# Patient Record
Sex: Female | Born: 1970
Health system: Southern US, Community
[De-identification: ages and names within clinical notes are randomized; demographics above are authoritative.]

## PROBLEM LIST (undated history)

## (undated) DIAGNOSIS — F329 Major depressive disorder, single episode, unspecified: Secondary | ICD-10-CM

## (undated) DIAGNOSIS — F32A Depression, unspecified: Secondary | ICD-10-CM

## (undated) DIAGNOSIS — F319 Bipolar disorder, unspecified: Secondary | ICD-10-CM

---

## 2018-05-27 ENCOUNTER — Ambulatory Visit (HOSPITAL_COMMUNITY)
Admission: EM | Admit: 2018-05-27 | Discharge: 2018-05-27 | Disposition: A | Discharge status: Home / Self Care | Payer: Medicaid Other | Attending: Family Medicine | Admitting: Family Medicine

## 2018-05-27 ENCOUNTER — Encounter (HOSPITAL_COMMUNITY): Payer: Self-pay | Admitting: Emergency Medicine

## 2018-05-27 DIAGNOSIS — M545 Low back pain, unspecified: Secondary | ICD-10-CM

## 2018-05-27 HISTORY — DX: Depression, unspecified: F32.A

## 2018-05-27 HISTORY — DX: Bipolar disorder, unspecified: F31.9

## 2018-05-27 HISTORY — DX: Major depressive disorder, single episode, unspecified: F32.9

## 2018-05-27 LAB — POCT URINALYSIS DIP (DEVICE)
Bilirubin Urine: NEGATIVE
GLUCOSE, UA: NEGATIVE mg/dL
Ketones, ur: NEGATIVE mg/dL
Leukocytes, UA: NEGATIVE
NITRITE: NEGATIVE
PROTEIN: NEGATIVE mg/dL
UROBILINOGEN UA: 0.2 mg/dL (ref 0.0–1.0)
pH: 5.5 (ref 5.0–8.0)

## 2018-05-27 MED ORDER — KETOROLAC TROMETHAMINE 60 MG/2ML IM SOLN
60.0000 mg | Freq: Once | INTRAMUSCULAR | Status: AC
  Administered 2018-05-27: 60 mg via INTRAMUSCULAR

## 2018-05-27 MED ORDER — KETOROLAC TROMETHAMINE 60 MG/2ML IM SOLN
INTRAMUSCULAR | Status: AC
  Filled 2018-05-27: qty 2

## 2018-05-27 MED ORDER — METHOCARBAMOL 500 MG PO TABS: 500.0000 mg | ORAL_TABLET | Freq: Four times a day (QID) | ORAL | 0 refills | Status: DC

## 2018-05-27 MED ORDER — NAPROXEN 500 MG PO TABS: 500.0000 mg | ORAL_TABLET | Freq: Two times a day (BID) | ORAL | 0 refills | Status: DC

## 2018-05-27 NOTE — ED Triage Notes (Signed)
Pt states she had lower back pain x1 month, pt states it went away but came back. Pt states the pain shoots through her back and down her hips. Pt also c/o pain in her L hand. Denies injury.

## 2018-05-27 NOTE — Discharge Instructions (Signed)
Back pain is most likely muscular, I would expect this to gradually improve over the next 2 weeks  We gave you a shot of Toradol today, please begin taking Naprosyn twice daily beginning tomorrow  You may supplement with Robaxin, this is a muscle relaxer, may cause increased sedation in combination with your Geodon and Lamictal.  Please limit use to at bedtime  Avoid heavy lifting  May alternate ice and heat  Please follow-up if developing weakness, changes in bowel or bladder control, numbness between thighs, persistent pain, worsening pain, developing new symptoms with back pain, nausea, vomiting, fevers

## 2018-05-28 NOTE — ED Provider Notes (Signed)
[facility]    CSN: [id number] Arrival date & time: 05/27/18  1614     History   Chief Complaint Chief Complaint  Patient presents with  . Back Pain    HPI Sierra Vargas is a 47 y.o. female history of bipolar type I, depression presenting today for evaluation of back pain.  Patient states that she has had back pain for approximately the past 2 months.  It initially went away, but returned again.  Patient has had pain to bilateral lower areas of her back.  Does slightly come into her groin.  She has been using Advil without relief.  Increased with movement.  Denies changes in bowel or bladder habits, denies saddle anesthesia.  Denies numbness or tingling radiating.  Denies any initiating injury or increase in activity.  Patient does note that she is on her feet for a large portion of the day while she is at work.  Denies urinary symptoms.  HPI  Past Medical History:  Diagnosis Date  . Bipolar 1 disorder (HCC)   . Depression     There are no active problems to display for this patient.   Past Surgical History:  Procedure Laterality Date  . CESAREAN SECTION      OB History   None      Home Medications    Prior to Admission medications   Medication Sig Start Date End Date Taking? Authorizing Provider  hydrOXYzine (VISTARIL) 50 MG capsule Take 50 mg by mouth 1 day or 1 dose.   Yes [provider]  lamoTRIgine (LAMICTAL) 150 MG tablet Take 150 mg by mouth daily.   Yes [provider]  ziprasidone (GEODON) 60 MG capsule Take 60 mg by mouth 1 day or 1 dose.   Yes [provider]  methocarbamol (ROBAXIN) 500 MG tablet Take 1 tablet (500 mg total) by mouth 4 (four) times daily. 05/27/18   Sierra Sparano C, PA-Vargas  naproxen (NAPROSYN) 500 MG tablet Take 1 tablet (500 mg total) by mouth 2 (two) times daily. 05/27/18   Sierra Vargas, Sierra Creamer, PA-Vargas    Family History Family History  Problem Relation Age of Onset  . Diabetes Father     Social  History Social History   Tobacco Use  . Smoking status: Never Smoker  Substance Use Topics  . Alcohol use: Never    Frequency: Never  . Drug use: Never     Allergies   Latex and Shellfish allergy   Review of Systems Review of Systems  Constitutional: Negative for fatigue and fever.  Eyes: Negative for visual disturbance.  Respiratory: Negative for shortness of breath.   Cardiovascular: Negative for chest pain.  Gastrointestinal: Negative for abdominal pain, nausea and vomiting.  Genitourinary: Negative for decreased urine volume and difficulty urinating.  Musculoskeletal: Positive for back pain and myalgias. Negative for arthralgias and joint swelling.  Skin: Negative for color change, rash and wound.  Neurological: Negative for dizziness, weakness, light-headedness and headaches.     Physical Exam Triage Vital Signs ED Triage Vitals  Enc Vitals Group     BP 05/27/18 1643 123/78     Pulse Rate 05/27/18 1643 74     Resp 05/27/18 1643 16     Temp 05/27/18 1643 97.8 F (36.6 Vargas)     Temp src --      SpO2 05/27/18 1643 100 %     Weight --      Height --      Head Circumference --  Peak Flow --      Pain Score 05/27/18 1811 4     Pain Loc --      Pain Edu? --      Excl. in GC? --    No data found.  Updated Vital Signs BP 123/78   Pulse 74   Temp 97.8 F (36.6 Vargas)   Resp 16   LMP 05/20/2018   SpO2 100%   Visual Acuity Right Eye Distance:   Left Eye Distance:   Bilateral Distance:    Right Eye Near:   Left Eye Near:    Bilateral Near:     Physical Exam  Constitutional: She appears well-developed and well-nourished. No distress.  HENT:  Head: Normocephalic and atraumatic.  Eyes: Conjunctivae are normal.  Neck: Neck supple.  Cardiovascular: Normal rate and regular rhythm.  No murmur heard. Pulmonary/Chest: Effort normal and breath sounds normal. No respiratory distress.  Abdominal: Soft. There is no tenderness.  Nontender to light and deep  palpation throughout all 4 quadrants, negative CVA tenderness  Musculoskeletal: She exhibits no edema.  Nontender to palpation of cervical, thoracic and lumbar spine midline, patient does have tenderness to bilateral lumbar musculature, negative straight leg raise bilaterally. Strength at hips 5/5 in all directions, strength and knees 5/5 bilaterally, patellar reflex 2+ bilaterally  Neurological: She is alert.  Skin: Skin is warm and dry.  Psychiatric: She has a normal mood and affect.  Nursing note and vitals reviewed.    UC Treatments / Results  Labs (all labs ordered are listed, but only abnormal results are displayed) Labs Reviewed  POCT URINALYSIS DIP (DEVICE) - Abnormal; Notable for the following components:      Result Value   Hgb urine dipstick MODERATE (*)    All other components within normal limits    EKG None  Radiology No results found.  Procedures Procedures (including critical care time)  Medications Ordered in UC Medications  ketorolac (TORADOL) injection 60 mg (60 mg Intramuscular Given 05/27/18 1810)    Initial Impression / Assessment and Plan / UC Course  I have reviewed the triage vital signs and the nursing notes.  Pertinent labs & imaging results that were available during my care of the patient were reviewed by me and considered in my medical decision making (see chart for details).     UA negative for infection, does have moderate hemoglobin.  Given length of symptoms post most likely kidney stone.  No neuro deficits or red flags concerning for cauda equina.  Will treat for muscular/lumbar strain with anti-inflammatories.  Will provide Toradol in clinic today, Naprosyn for use at home, advised to begin tomorrow.  Will provide Robaxin in order to limit sedation, but advised patient that in combination with her Geodon Lamictal this will increase her sedation.  Rest always at bedtime.  Ice and heat.  Activity modification, but avoiding bedrest.Discussed  strict return precautions. Patient verbalized understanding and is agreeable with plan.  Final Clinical Impressions(s) / UC Diagnoses   Final diagnoses:  Acute bilateral low back pain without sciatica     Discharge Instructions     Back pain is most likely muscular, I would expect this to gradually improve over the next 2 weeks  We gave you a shot of Toradol today, please begin taking Naprosyn twice daily beginning tomorrow  You may supplement with Robaxin, this is a muscle relaxer, may cause increased sedation in combination with your Geodon and Lamictal.  Please limit use to at bedtime  Avoid  heavy lifting  May alternate ice and heat  Please follow-up if developing weakness, changes in bowel or bladder control, numbness between thighs, persistent pain, worsening pain, developing new symptoms with back pain, nausea, vomiting, fevers   ED Prescriptions    Medication Sig Dispense Auth. Provider   methocarbamol (ROBAXIN) 500 MG tablet Take 1 tablet (500 mg total) by mouth 4 (four) times daily. 24 tablet Mariaisabel Bodiford C, PA-Vargas   naproxen (NAPROSYN) 500 MG tablet Take 1 tablet (500 mg total) by mouth 2 (two) times daily. 30 tablet Rease Swinson, [place] C, PA-Vargas     Controlled Substance Prescriptions [place] Controlled Substance Registry consulted? Not Applicable   Lew Dawes, [place] 05/28/18 1032

## 2018-06-29 ENCOUNTER — Encounter: Payer: Self-pay | Admitting: Physician Assistant

## 2018-06-29 ENCOUNTER — Ambulatory Visit (INDEPENDENT_AMBULATORY_CARE_PROVIDER_SITE_OTHER): Payer: No Typology Code available for payment source

## 2018-06-29 ENCOUNTER — Ambulatory Visit (INDEPENDENT_AMBULATORY_CARE_PROVIDER_SITE_OTHER): Payer: No Typology Code available for payment source | Admitting: Physician Assistant

## 2018-06-29 VITALS — BP 128/84 | HR 71 | Temp 98.1°F | Ht 69.0 in | Wt 245.2 lb

## 2018-06-29 DIAGNOSIS — R319 Hematuria, unspecified: Secondary | ICD-10-CM

## 2018-06-29 DIAGNOSIS — M545 Low back pain, unspecified: Secondary | ICD-10-CM

## 2018-06-29 DIAGNOSIS — J029 Acute pharyngitis, unspecified: Secondary | ICD-10-CM | POA: Diagnosis not present

## 2018-06-29 LAB — URINALYSIS, ROUTINE W REFLEX MICROSCOPIC
BILIRUBIN URINE: NEGATIVE
Ketones, ur: NEGATIVE
Leukocytes, UA: NEGATIVE
NITRITE: NEGATIVE
PH: 5.5 (ref 5.0–8.0)
Specific Gravity, Urine: 1.015 (ref 1.000–1.030)
TOTAL PROTEIN, URINE-UPE24: NEGATIVE
UROBILINOGEN UA: 0.2 (ref 0.0–1.0)
Urine Glucose: NEGATIVE

## 2018-06-29 LAB — POCT URINE PREGNANCY: Preg Test, Ur: NEGATIVE

## 2018-06-29 LAB — POCT RAPID STREP A (OFFICE): Rapid Strep A Screen: NEGATIVE

## 2018-06-29 MED ORDER — AMOXICILLIN-POT CLAVULANATE 875-125 MG PO TABS: 1.0000 | ORAL_TABLET | Freq: Two times a day (BID) | ORAL | 0 refills | Status: DC

## 2018-06-29 MED ORDER — MELOXICAM 15 MG PO TABS: 15.0000 mg | ORAL_TABLET | Freq: Every day | ORAL | 0 refills | Status: DC

## 2018-06-29 NOTE — Patient Instructions (Addendum)
It was great to see you!  Use medication as prescribed: Augmentin antibiotic  Start Meloxicam daily anti-inflammatory medication for your back pain. I will call you with your urine and xray results.   Push fluids and get plenty of rest. Please return if you are not improving as expected, or if you have high fevers (>101.5) or difficulty swallowing or worsening productive cough.  Call clinic with questions.  I hope you start feeling better soon!

## 2018-06-29 NOTE — Progress Notes (Signed)
Sierra Vargas is a 47 y.o. female here for a new problem.  History of Present Illness:   Chief Complaint  Patient presents with  . New Patient (Initial Visit)  . Cough  . Nasal Congestion    HPI  URI Body aches, nasal congestion, cough started yesterday. Denies SOB and chest pain. She has been taking claritin-D. No history of asthma or PNA. She works in a [employer] and suspects that she has had multiple sick contacts. Appetite is fair. Denies fever.  Back pain She would also like to discuss her back pain today. She went to urgent care about 1 month ago and complained of back pain x 2 months at that time. She received a Toradol shot and had relief from that. She states that since that time her pain has started to radiate down her bilateral legs. She spends all day on her feet. She has also tried advil without relief. They also performed a UA at that time and she states that she was not on her period, however she did have blood in her urine.  Past Medical History:  Diagnosis Date  . Bipolar 1 disorder (HCC)   . Depression      Social History   Socioeconomic History  . Marital status: Single    Spouse name: Not on file  . Number of children: Not on file  . Years of education: Not on file  . Highest education level: Not on file  Occupational History  . Not on file  Social Needs  . Financial resource strain: Not on file  . Food insecurity:    Worry: Not on file    Inability: Not on file  . Transportation needs:    Medical: Not on file    Non-medical: Not on file  Tobacco Use  . Smoking status: Former Smoker    Types: Cigarettes  . Smokeless tobacco: Never Used  Substance and Sexual Activity  . Alcohol use: Not Currently    Frequency: Never  . Drug use: Not Currently    Types: Marijuana, Cocaine  . Sexual activity: Yes  Lifestyle  . Physical activity:    Days per week: Not on file    Minutes per session: Not on file  . Stress: Not on file  Relationships   . Social connections:    Talks on phone: Not on file    Gets together: Not on file    Attends religious service: Not on file    Active member of club or organization: Not on file    Attends meetings of clubs or organizations: Not on file    Relationship status: Not on file  . Intimate partner violence:    Fear of current or ex partner: Not on file    Emotionally abused: Not on file    Physically abused: Not on file    Forced sexual activity: Not on file  Other Topics Concern  . Not on file  Social History Narrative  . Not on file    Past Surgical History:  Procedure Laterality Date  . CESAREAN SECTION      Family History  Problem Relation Age of Onset  . HIV Father   . Diabetes Mother   . Hypertension Mother   . Lung disease Mother   . Early death Brother   . Cancer Maternal Grandmother   . Diabetes Paternal Grandmother   . HIV Brother     Allergies  Allergen Reactions  . Latex   . Shellfish Allergy  Current Medications:   Current Outpatient Medications:  .  hydrOXYzine (VISTARIL) 50 MG capsule, Take 50 mg by mouth 1 day or 1 dose., Disp: , Rfl:  .  lamoTRIgine (LAMICTAL) 150 MG tablet, Take 150 mg by mouth daily., Disp: , Rfl:  .  ziprasidone (GEODON) 60 MG capsule, Take 60 mg by mouth 1 day or 1 dose., Disp: , Rfl:  .  amoxicillin-clavulanate (AUGMENTIN) 875-125 MG tablet, Take 1 tablet by mouth 2 (two) times daily., Disp: 20 tablet, Rfl: 0 .  meloxicam (MOBIC) 15 MG tablet, Take 1 tablet (15 mg total) by mouth daily., Disp: 30 tablet, Rfl: 0 .  mupirocin ointment (BACTROBAN) 2 %, APPLY TOPICALLY TO AFFECTED AREA 3 TIMES A DAY. APPLY TO RIGHT GREAT TOE, Disp: , Rfl: 2   Review of Systems:   ROS Negative unless otherwise specified per HPI.  Vitals:   Vitals:   06/29/18 0918  BP: 128/84  Pulse: 71  Temp: 98.1 F (36.7 C)  TempSrc: Oral  SpO2: 94%  Weight: 245 lb 3.2 oz (111.2 kg)  Height: 5\' 9"  (1.753 m)     Body mass index is 36.21  kg/m.  Physical Exam:   Physical Exam  Constitutional: She appears well-developed. She is cooperative.  Non-toxic appearance. She does not have a sickly appearance. She does not appear ill. No distress.  HENT:  Head: Normocephalic and atraumatic.  Right Ear: Tympanic membrane, external ear and ear canal normal. Tympanic membrane is not erythematous, not retracted and not bulging.  Left Ear: Tympanic membrane, external ear and ear canal normal. Tympanic membrane is not erythematous, not retracted and not bulging.  Nose: Nose normal. Right sinus exhibits no maxillary sinus tenderness and no frontal sinus tenderness. Left sinus exhibits no maxillary sinus tenderness and no frontal sinus tenderness.  Mouth/Throat: Uvula is midline and mucous membranes are normal. Posterior oropharyngeal erythema present. No posterior oropharyngeal edema. Tonsils are 2+ on the right. Tonsils are 2+ on the left. Tonsillar exudate.  Eyes: Conjunctivae and lids are normal.  Neck: Trachea normal.  Cardiovascular: Normal rate, regular rhythm, S1 normal, S2 normal and normal heart sounds.  Pulmonary/Chest: Effort normal and breath sounds normal. She has no decreased breath sounds. She has no wheezes. She has no rhonchi. She has no rales.  Musculoskeletal:  No decreased ROM 2/2 pain with flexion/extension, lateral side bends, or rotation. Reproducible tenderness with deep palpation to bilateral paraspinal muscles. No bony tenderness. No evidence of erythema, rash or ecchymosis.   Lymphadenopathy:    She has no cervical adenopathy.  Neurological: She is alert.  Skin: Skin is warm, dry and intact.  Psychiatric: She has a normal mood and affect. Her speech is normal and behavior is normal.  Nursing note and vitals reviewed.  Results for orders placed or performed in visit on 06/29/18  Urinalysis, Routine w reflex microscopic  Result Value Ref Range   Color, Urine YELLOW Yellow;Lt. Yellow   APPearance CLEAR Clear    Specific Gravity, Urine 1.015 1.000 - 1.030   pH 5.5 5.0 - 8.0   Total Protein, Urine NEGATIVE Negative   Urine Glucose NEGATIVE Negative   Ketones, ur NEGATIVE Negative   Bilirubin Urine NEGATIVE Negative   Hgb urine dipstick MODERATE (A) Negative   Urobilinogen, UA 0.2 0.0 - 1.0   Leukocytes, UA NEGATIVE Negative   Nitrite NEGATIVE Negative   WBC, UA 0-2/hpf 0-2/hpf   RBC / HPF 0-2/hpf 0-2/hpf   Squamous Epithelial / LPF Rare(0-4/hpf) Rare(0-4/hpf)   Bacteria,  UA Rare(<10/hpf) (A) None  POCT urine pregnancy  Result Value Ref Range   Preg Test, Ur Negative Negative  POCT rapid strep A  Result Value Ref Range   Rapid Strep A Screen Negative Negative   CLINICAL DATA:  Bilateral low back pain  EXAM: LUMBAR SPINE - 2-3 VIEW  COMPARISON:  None.  FINDINGS: There is no evidence of lumbar spine fracture. Alignment is normal. Intervertebral disc spaces are maintained.  IMPRESSION: Negative.   Electronically Signed   By: Elige Ko   On: 06/29/2018 14:46  Assessment and Plan:    Franca was seen today for new patient (initial visit), cough and nasal congestion.  Diagnoses and all orders for this visit:  Lumbar back pain; Hematuria, unspecified type Xray films without any evidence of fracture. I discussed using Mobic daily and follow-up with Dr. Berline Chough if symptoms persist. She is now working full time and is on her feet all day so I think that this is greatly contributing to her symptoms. She does continue to have blood in her urine. She denies any dysuria or pelvic pressure, but will further work-up hematuria with CT vs referral to urology for possible stone or other finding. -     POCT urine pregnancy -     Urinalysis, Routine w reflex microscopic -     DG Lumbar Spine 2-3 Views  Sore throat No red flags on exam.  Strep negative. Will initiate Augmentin per orders. Discussed taking medications as prescribed. Reviewed return precautions including worsening fever,  SOB, worsening cough or other concerns. Push fluids and rest. I recommend that patient follow-up if symptoms worsen or persist despite treatment x 7-10 days, sooner if needed. -     POCT rapid strep A  Other orders -     amoxicillin-clavulanate (AUGMENTIN) 875-125 MG tablet; Take 1 tablet by mouth 2 (two) times daily. -     meloxicam (MOBIC) 15 MG tablet; Take 1 tablet (15 mg total) by mouth daily.   . Reviewed expectations re: course of current medical issues. . Discussed self-management of symptoms. . Outlined signs and symptoms indicating need for more acute intervention. . Patient verbalized understanding and all questions were answered. . See orders for this visit as documented in the electronic medical record. . Patient received an After-Visit Summary.  Jarold Motto, PA-C

## 2018-06-30 ENCOUNTER — Other Ambulatory Visit: Payer: Self-pay

## 2018-07-01 ENCOUNTER — Other Ambulatory Visit: Payer: Self-pay | Admitting: Physician Assistant

## 2018-07-01 ENCOUNTER — Ambulatory Visit (INDEPENDENT_AMBULATORY_CARE_PROVIDER_SITE_OTHER)
Admission: RE | Admit: 2018-07-01 | Discharge: 2018-07-01 | Disposition: A | Discharge status: Home / Self Care | Payer: No Typology Code available for payment source | Source: Ambulatory Visit | Attending: Physician Assistant | Admitting: Physician Assistant

## 2018-07-01 DIAGNOSIS — R109 Unspecified abdominal pain: Secondary | ICD-10-CM | POA: Diagnosis not present

## 2018-07-02 ENCOUNTER — Other Ambulatory Visit: Payer: No Typology Code available for payment source

## 2018-07-06 ENCOUNTER — Telehealth: Payer: Self-pay | Admitting: Physician Assistant

## 2018-07-06 MED ORDER — FLUCONAZOLE 150 MG PO TABS: 150.0000 mg | ORAL_TABLET | Freq: Once | ORAL | 0 refills | Status: AC

## 2018-07-06 NOTE — Addendum Note (Signed)
Addended by: Jimmye Norman on: 07/06/2018 03:22 PM   Modules accepted: Orders

## 2018-07-06 NOTE — Telephone Encounter (Signed)
See note  Copied from CRM [id number]. Topic: General - Other >> Jul 06, 2018 12:56 PM Percival Spanish wrote:  Pt call to say the below med gave her a yeast infection and is asking if something can be called in for her   amoxicillin-clavulanate (AUGMENTIN) 875-125 MG tablet   Pharmacy [facility]

## 2018-07-06 NOTE — Telephone Encounter (Signed)
Spoke to pt told her Rx for Diflucan 1 tablet was sent to pharmacy. Pt verbalized understanding. 

## 2018-07-06 NOTE — Telephone Encounter (Signed)
Discussed with Lelon Mast, order given for Diflucan 1 tablet.

## 2018-07-08 ENCOUNTER — Ambulatory Visit: Payer: Self-pay | Admitting: *Deleted

## 2018-07-08 MED ORDER — ALBUTEROL SULFATE HFA 108 (90 BASE) MCG/ACT IN AERS: 2.0000 | INHALATION_SPRAY | RESPIRATORY_TRACT | 1 refills | Status: DC | PRN

## 2018-07-08 NOTE — Telephone Encounter (Signed)
May send in Albuterol inhaler for patient.   Definitely needs to be seen if symptoms worsen or persist.

## 2018-07-08 NOTE — Addendum Note (Signed)
Addended by: Jimmye Norman on: 07/08/2018 10:55 AM   Modules accepted: Orders

## 2018-07-08 NOTE — Telephone Encounter (Signed)
Please see message and advise 

## 2018-07-08 NOTE — Telephone Encounter (Signed)
Pt was previously seen in the office on Oct 28 th for cough and nasal congestion. She stated that she took the antibiotic and feeling somewhat better but now has some chest congestion and still has the cough. She states she has a hx of asthma. She found an old inhaler (outdated from 4 years ago) and used that. She is afraid that the cold is going to trigger her asthma. She breathes with her mouth opened and feels like she cant get air in.  Has a runny nose but no nasal congestion. Encourage to drink plenty of fluids to keep her system flushed.  She is requesting an inhaler. Unable to schedule an appointment right now because of wanting to schedule after work.  Recommend if she cant be seen today in the office,  to go to an Urgent Care. She will go to the [facility] if needed. Will route to flow at [facility] at [place].  Reason for Disposition . [1] MILD difficulty breathing (e.g., minimal/no SOB at rest, SOB with walking, pulse <100) AND [2] NEW-onset or WORSE than normal  Answer Assessment - Initial Assessment Questions 1. RESPIRATORY STATUS: "Describe your breathing?" (e.g., wheezing, shortness of breath, unable to speak, severe coughing)      Shortness of breath, chest tightness 2. ONSET: "When did this breathing problem begin?"      Oct 29th 3. PATTERN "Does the difficult breathing come and go, or has it been constant since it started?"      Not constant 4. SEVERITY: "How bad is your breathing?" (e.g., mild, moderate, severe)    - MILD: No SOB at rest, mild SOB with walking, speaks normally in sentences, can lay down, no retractions, pulse < 100.    - MODERATE: SOB at rest, SOB with minimal exertion and prefers to sit, cannot lie down flat, speaks in phrases, mild retractions, audible wheezing, pulse 100-120.    - SEVERE: Very SOB at rest, speaks in single words, struggling to breathe, sitting hunched forward, retractions, pulse > 120      mild 5. RECURRENT SYMPTOM: "Have you  had difficulty breathing before?" If so, ask: "When was the last time?" and "What happened that time?"      Hx of asthma 6. CARDIAC HISTORY: "Do you have any history of heart disease?" (e.g., heart attack, angina, bypass surgery, angioplasty)      no 7. LUNG HISTORY: "Do you have any history of lung disease?"  (e.g., pulmonary embolus, asthma, emphysema)     Hx of asthma 8. CAUSE: "What do you think is causing the breathing problem?"      Having a cold? 9. OTHER SYMPTOMS: "Do you have any other symptoms? (e.g., dizziness, runny nose, cough, chest pain, fever)     Feeling hot at times, some dizziness, chest tightness, runny nose 10. PREGNANCY: "Is there any chance you are pregnant?" "When was your last menstrual period?"       LMP Oct 10th, no chance of pregnancy  11. TRAVEL: "Have you traveled out of the country in the last month?" (e.g., travel history, exposures)       no  Protocols used: BREATHING DIFFICULTY-A-AH

## 2018-07-08 NOTE — Telephone Encounter (Signed)
Spoke to pt told her Sierra Vargas said okay to send in Albuterol Inhaler just need to verify pharmacy [facility]. Pt verbalized understanding and said yes that is correct. Told pt will send Rx to pharmacy but if symptoms get worse need to be seen. Pt verbalized understanding and said she is going to Urgent Care after work. Told pt okay.

## 2018-08-13 ENCOUNTER — Ambulatory Visit (INDEPENDENT_AMBULATORY_CARE_PROVIDER_SITE_OTHER): Payer: No Typology Code available for payment source | Admitting: Physician Assistant

## 2018-08-13 ENCOUNTER — Encounter: Payer: Self-pay | Admitting: Physician Assistant

## 2018-08-13 VITALS — BP 120/76 | HR 60 | Temp 98.0°F | Ht 69.0 in | Wt 248.0 lb

## 2018-08-13 DIAGNOSIS — M545 Low back pain, unspecified: Secondary | ICD-10-CM

## 2018-08-13 DIAGNOSIS — R109 Unspecified abdominal pain: Secondary | ICD-10-CM

## 2018-08-13 DIAGNOSIS — N912 Amenorrhea, unspecified: Secondary | ICD-10-CM | POA: Diagnosis not present

## 2018-08-13 DIAGNOSIS — R319 Hematuria, unspecified: Secondary | ICD-10-CM | POA: Diagnosis not present

## 2018-08-13 LAB — CBC WITH DIFFERENTIAL/PLATELET
Basophils Absolute: 0 10*3/uL (ref 0.0–0.1)
Basophils Relative: 0.5 % (ref 0.0–3.0)
EOS ABS: 0 10*3/uL (ref 0.0–0.7)
Eosinophils Relative: 0 % (ref 0.0–5.0)
HEMATOCRIT: 35.4 % — AB (ref 36.0–46.0)
Hemoglobin: 11.8 g/dL — ABNORMAL LOW (ref 12.0–15.0)
LYMPHS PCT: 27.4 % (ref 12.0–46.0)
Lymphs Abs: 1.3 10*3/uL (ref 0.7–4.0)
MCHC: 33.3 g/dL (ref 30.0–36.0)
MCV: 80.5 fl (ref 78.0–100.0)
MONO ABS: 0.4 10*3/uL (ref 0.1–1.0)
MONOS PCT: 7.6 % (ref 3.0–12.0)
Neutro Abs: 3.1 10*3/uL (ref 1.4–7.7)
Neutrophils Relative %: 64.5 % (ref 43.0–77.0)
Platelets: 229 10*3/uL (ref 150.0–400.0)
RBC: 4.4 Mil/uL (ref 3.87–5.11)
RDW: 14.2 % (ref 11.5–15.5)
WBC: 4.8 10*3/uL (ref 4.0–10.5)

## 2018-08-13 LAB — URINALYSIS, ROUTINE W REFLEX MICROSCOPIC
Bilirubin Urine: NEGATIVE
KETONES UR: NEGATIVE
Leukocytes, UA: NEGATIVE
Nitrite: NEGATIVE
Total Protein, Urine: NEGATIVE
Urine Glucose: NEGATIVE
Urobilinogen, UA: 0.2 (ref 0.0–1.0)
pH: 5.5 (ref 5.0–8.0)

## 2018-08-13 LAB — COMPREHENSIVE METABOLIC PANEL
ALT: 11 U/L (ref 0–35)
AST: 18 U/L (ref 0–37)
Albumin: 4.1 g/dL (ref 3.5–5.2)
Alkaline Phosphatase: 88 U/L (ref 39–117)
BUN: 11 mg/dL (ref 6–23)
CALCIUM: 9.2 mg/dL (ref 8.4–10.5)
CO2: 27 mEq/L (ref 19–32)
Chloride: 105 mEq/L (ref 96–112)
Creatinine, Ser: 0.88 mg/dL (ref 0.40–1.20)
GFR: 87.84 mL/min (ref >60.00)
GLUCOSE: 102 mg/dL — AB (ref 70–99)
POTASSIUM: 4 meq/L (ref 3.5–5.1)
SODIUM: 138 meq/L (ref 135–145)
TOTAL PROTEIN: 6.8 g/dL (ref 6.0–8.3)
Total Bilirubin: 0.3 mg/dL (ref 0.2–1.2)

## 2018-08-13 LAB — HCG, QUANTITATIVE, PREGNANCY: Quantitative HCG: 0.36 m[IU]/mL

## 2018-08-13 MED ORDER — KETOROLAC TROMETHAMINE 60 MG/2ML IM SOLN
60.0000 mg | Freq: Once | INTRAMUSCULAR | Status: AC
  Administered 2018-08-13: 60 mg via INTRAMUSCULAR

## 2018-08-13 NOTE — Progress Notes (Signed)
Sierra Vargas is a 47 y.o. female here for a new problem.  I acted as a Neurosurgeonscribe for Energy East CorporationSamantha Kalmen Lollar, PA-C Corky Mullonna Orphanos, LPN  History of Present Illness:   Chief Complaint  Patient presents with  . Back Pain    Back Pain  This is a recurrent problem. Episode onset: Started on August went to Urgent Care in September and was given and injection. The problem occurs intermittently. The problem has been gradually worsening since onset. The pain is present in the lumbar spine. The pain radiates to the left thigh and right thigh (L>R). The pain is at a severity of 4/10 (when severe can be a 10.). The pain is moderate. The pain is worse during the day. The symptoms are aggravated by bending, twisting and position. Stiffness is present in the morning. Associated symptoms include bladder incontinence (wears pantyliner due to leakage) and headaches. Pertinent negatives include no dysuria, fever, leg pain, numbness, pelvic pain, tingling or weakness. Treatments tried: Aspercreme and Advil. The treatment provided moderate relief.   States that she cannot tolerate oral steroids or steroid injections as they cause elevated blood sugars.  SOB Random. Can be with activity. Asthma when she was younger and she grew out of it. Does have albuterol inhaler that she uses prn, helps. Denies: chest pain, chest tightness, sore throat, unexplained weight changes, fevers, chills.  Hematuria We are also overdue for re-checking her urine for blood. Urine showed blood in Oct  2019. Denies dysuria, gross hematuria, or flank pain.   Wt Readings from Last 4 Encounters:  08/13/18 248 lb (112.5 kg)  06/29/18 245 lb 3.2 oz (111.2 kg)   Irregular periods Patient's last menstrual period was 08/06/2018. She is having spotting about 1 week earlier than she usually does. Denies pregnancy, took urine pregnancy test yesterday which was negative. Has some L sided "heaviness" near her ovaries. Hx of ovarian cysts. States that she  is overdue for repeat PAP. Denies vaginal discharge.   Past Medical History:  Diagnosis Date  . Bipolar 1 disorder (HCC)   . Depression      Social History   Socioeconomic History  . Marital status: Single    Spouse name: Not on file  . Number of children: Not on file  . Years of education: Not on file  . Highest education level: Not on file  Occupational History  . Not on file  Social Needs  . Financial resource strain: Not on file  . Food insecurity:    Worry: Not on file    Inability: Not on file  . Transportation needs:    Medical: Not on file    Non-medical: Not on file  Tobacco Use  . Smoking status: Former Smoker    Types: Cigarettes  . Smokeless tobacco: Never Used  Substance and Sexual Activity  . Alcohol use: Not Currently    Frequency: Never  . Drug use: Not Currently    Types: Marijuana, Cocaine  . Sexual activity: Yes  Lifestyle  . Physical activity:    Days per week: Not on file    Minutes per session: Not on file  . Stress: Not on file  Relationships  . Social connections:    Talks on phone: Not on file    Gets together: Not on file    Attends religious service: Not on file    Active member of club or organization: Not on file    Attends meetings of clubs or organizations: Not on file  Relationship status: Not on file  . Intimate partner violence:    Fear of current or ex partner: Not on file    Emotionally abused: Not on file    Physically abused: Not on file    Forced sexual activity: Not on file  Other Topics Concern  . Not on file  Social History Narrative  . Not on file    Past Surgical History:  Procedure Laterality Date  . CESAREAN SECTION      Family History  Problem Relation Age of Onset  . HIV Father   . Diabetes Mother   . Hypertension Mother   . Lung disease Mother   . Early death Brother   . Cancer Maternal Grandmother   . Diabetes Paternal Grandmother   . HIV Brother     Allergies  Allergen Reactions  .  Latex   . Shellfish Allergy     Current Medications:   Current Outpatient Medications:  .  albuterol (PROVENTIL HFA;VENTOLIN HFA) 108 (90 Base) MCG/ACT inhaler, Inhale 2 puffs into the lungs every 4 (four) hours as needed for wheezing or shortness of breath., Disp: 1 Inhaler, Rfl: 1 .  hydrOXYzine (VISTARIL) 50 MG capsule, Take 50 mg by mouth 1 day or 1 dose., Disp: , Rfl:  .  lamoTRIgine (LAMICTAL) 150 MG tablet, Take 150 mg by mouth daily., Disp: , Rfl:  .  mupirocin ointment (BACTROBAN) 2 %, APPLY TOPICALLY TO AFFECTED AREA 3 TIMES A DAY. APPLY TO RIGHT GREAT TOE, Disp: , Rfl: 2 .  ziprasidone (GEODON) 60 MG capsule, Take 60 mg by mouth 1 day or 1 dose., Disp: , Rfl:  .  meloxicam (MOBIC) 15 MG tablet, Take 1 tablet (15 mg total) by mouth daily. (Patient not taking: Reported on 08/13/2018), Disp: 30 tablet, Rfl: 0   Review of Systems:   Review of Systems  Constitutional: Negative for fever.  Genitourinary: Positive for bladder incontinence (wears pantyliner due to leakage). Negative for dysuria and pelvic pain.  Musculoskeletal: Positive for back pain.  Neurological: Positive for headaches. Negative for tingling, weakness and numbness.    Vitals:   Vitals:   08/13/18 1055  BP: 120/76  Pulse: 60  Temp: 98 F (36.7 C)  TempSrc: Oral  SpO2: 96%  Weight: 248 lb (112.5 kg)  Height: 5\' 9"  (1.753 m)     Body mass index is 36.62 kg/m.  Physical Exam:   Physical Exam Vitals signs and nursing note reviewed.  Constitutional:      General: She is not in acute distress.    Appearance: She is well-developed. She is not ill-appearing or toxic-appearing.  HENT:     Head: Normocephalic and atraumatic.  Cardiovascular:     Rate and Rhythm: Normal rate and regular rhythm.     Heart sounds: Normal heart sounds.  Pulmonary:     Effort: Pulmonary effort is normal. No accessory muscle usage or respiratory distress.     Breath sounds: Normal breath sounds.  Abdominal:      Tenderness: There is no abdominal tenderness. There is no right CVA tenderness or left CVA tenderness.  Musculoskeletal:     Comments: No decreased ROM 2/2 pain with flexion/extension, lateral side bends, or rotation. Reproducible tenderness with deep palpation to bilateral lumbar paraspinal muscles. No bony tenderness. No evidence of erythema, rash or ecchymosis. Negative STLR bilaterally.   Skin:    General: Skin is warm and dry.  Neurological:     Mental Status: She is alert.  Psychiatric:  Speech: Speech normal.        Behavior: Behavior is cooperative.     Assessment and Plan:   Cherrie was seen today for back pain.  Diagnoses and all orders for this visit:  Abdominal pain, unspecified abdominal location; Amenorrhea No red flags on exam. States that urine preg was negative yesterday. Blood beta hcg today. She thinks she may have recurrence of ovarian cysts. She would like to opt for watchful waiting. She is going to pursue ob-gyn appointment. Declined vaginal/pelvic u/s at this time. Reviewed ER precautions with patient. -     CBC with Differential/Platelet -     Comprehensive metabolic panel  Lumbar back pain She is requesting toradol injection. Declined urine preg today but was agreeable to beta hcg. She verbalized understanding of risk of toradol injection should she be pregnant. CMA, Barnie Mort present for this verbal consent. Follow-up if symptoms persist despite treatment. Suspect related to standing at job all day and obesity. -     CBC with Differential/Platelet -     Comprehensive metabolic panel -     ketorolac (TORADOL) injection 60 mg  Hematuria, unspecified type Repeat UA today. -     Urinalysis, Routine w reflex microscopic   . Reviewed expectations re: course of current medical issues. . Discussed self-management of symptoms. . Outlined signs and symptoms indicating need for more acute intervention. . Patient verbalized understanding and all  questions were answered. . See orders for this visit as documented in the electronic medical record. . Patient received an After-Visit Summary.  CMA or LPN served as scribe during this visit. History, Physical, and Plan performed by medical provider. The above documentation has been reviewed and is accurate and complete.  Jarold Motto, PA-C

## 2018-08-13 NOTE — Patient Instructions (Signed)
It was great to see you!  We will contact you with your lab results.  Please contact an ob-gyn (see green paper) to set up appointment for PAP smear and irregular periods.  Continue asthma inhaler as needed for shortness of breath. Please follow-up with us if symptoms persist despite treatment.   Take care,  Jarold MottoSamantha Felipe Cabell PA-C

## 2018-08-22 ENCOUNTER — Other Ambulatory Visit: Payer: Self-pay | Admitting: Physician Assistant

## 2018-09-28 ENCOUNTER — Other Ambulatory Visit: Payer: Self-pay | Admitting: Physician Assistant

## 2018-10-14 ENCOUNTER — Other Ambulatory Visit: Payer: Self-pay | Admitting: *Deleted

## 2018-10-14 MED ORDER — ALBUTEROL SULFATE HFA 108 (90 BASE) MCG/ACT IN AERS: 2.0000 | INHALATION_SPRAY | RESPIRATORY_TRACT | 1 refills | Status: AC | PRN

## 2020-03-31 IMAGING — DX DG LUMBAR SPINE 2-3V
3 series · 3 of 3 positions shown · non-contrast
Comparison: None.

CLINICAL DATA: Bilateral low back pain

EXAM:
LUMBAR SPINE - 2-3 VIEW

[lumbar spine ap]
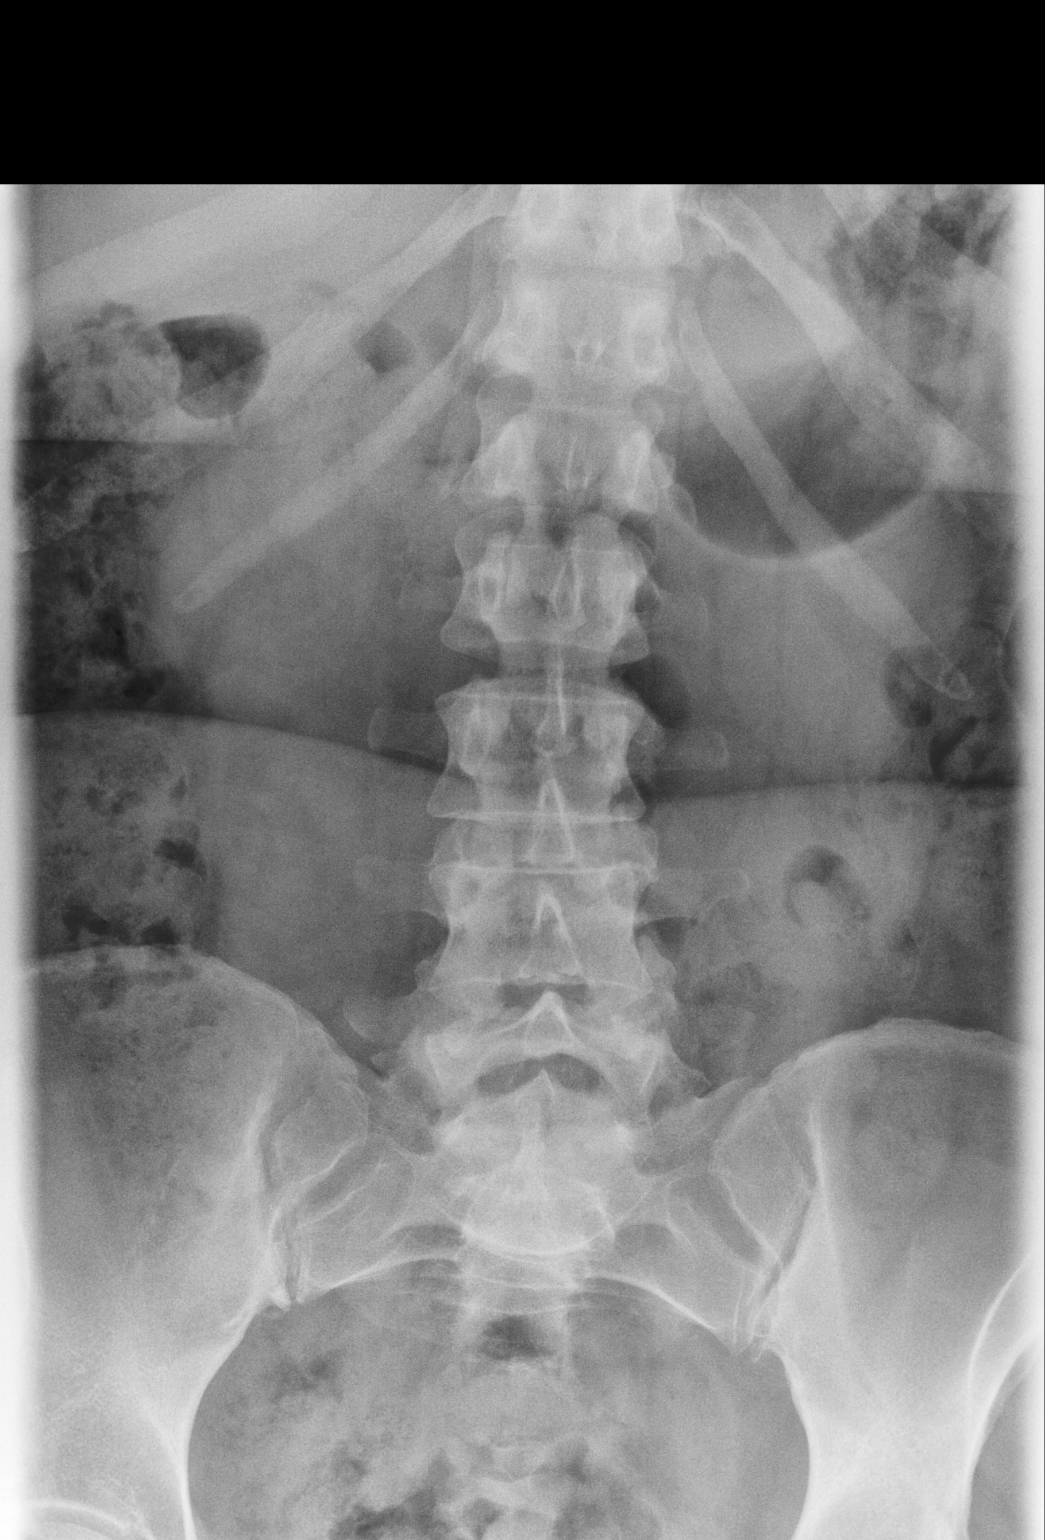

[lumbar spine lat (1 of 2)]
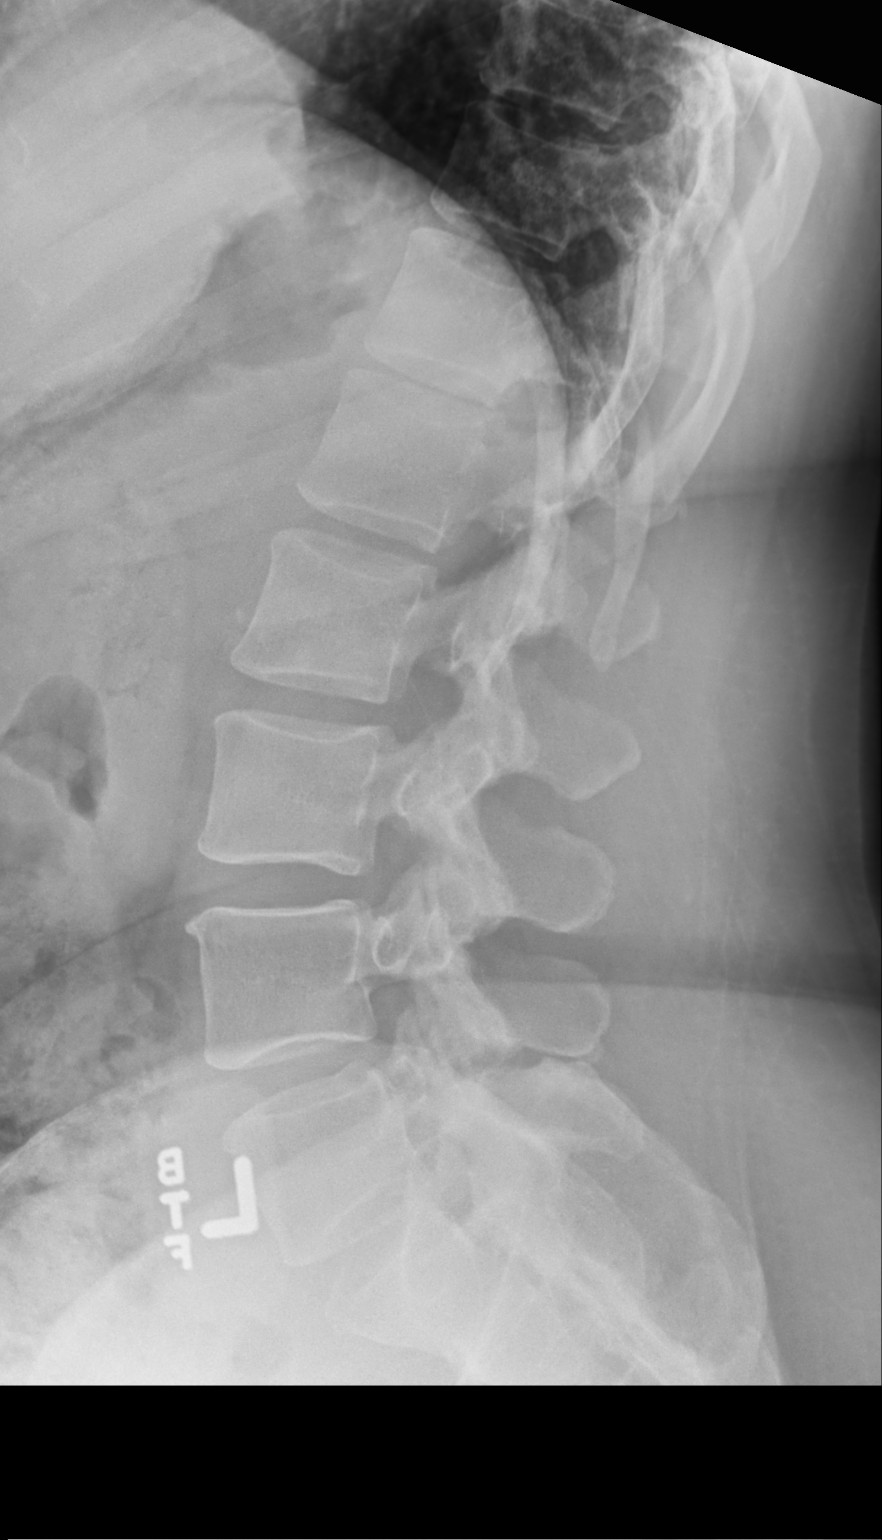

[lumbar spine lat (2 of 2)]
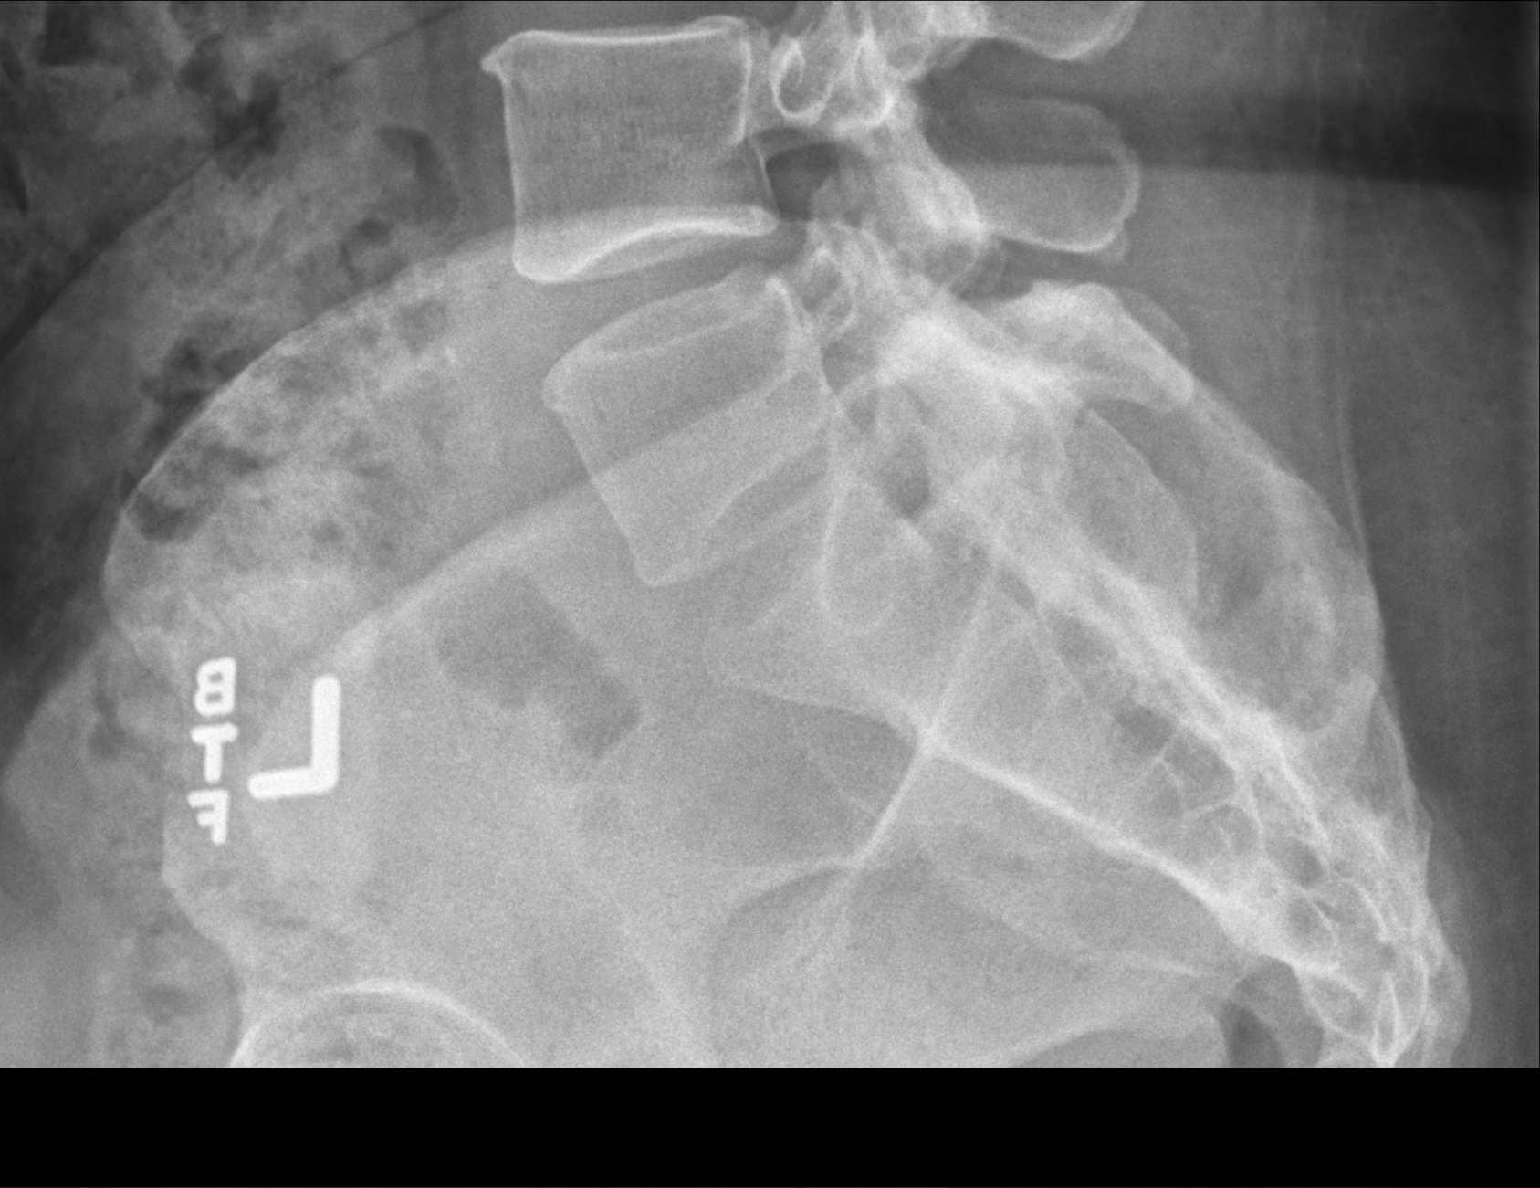

[3 of 3 positions shown; findings below may reference images not displayed]

FINDINGS: There is no evidence of lumbar spine fracture. Alignment is normal.
Intervertebral disc spaces are maintained.
IMPRESSION: Negative.

## 2022-05-27 ENCOUNTER — Encounter: Payer: Self-pay | Admitting: *Deleted
